# Patient Record
Sex: Male | Born: 1955
Health system: Southern US, Community
[De-identification: ages and names within clinical notes are randomized; demographics above are authoritative.]

## PROBLEM LIST (undated history)

## (undated) DIAGNOSIS — C4492 Squamous cell carcinoma of skin, unspecified: Secondary | ICD-10-CM

## (undated) DIAGNOSIS — K635 Polyp of colon: Secondary | ICD-10-CM

## (undated) DIAGNOSIS — I10 Essential (primary) hypertension: Secondary | ICD-10-CM

## (undated) HISTORY — PX: BACK SURGERY: SHX140

## (undated) HISTORY — PX: OTHER SURGICAL HISTORY: SHX169

## (undated) HISTORY — PX: HERNIA REPAIR: SHX51

## (undated) HISTORY — DX: Squamous cell carcinoma of skin, unspecified: C44.92

## (undated) HISTORY — PX: COLONOSCOPY: SHX174

---

## 1999-08-13 HISTORY — PX: OTHER SURGICAL HISTORY: SHX169

## 2006-10-20 ENCOUNTER — Ambulatory Visit (HOSPITAL_COMMUNITY): Admission: RE | Admit: 2006-10-20 | Discharge: 2006-10-20 | Payer: Self-pay | Admitting: Internal Medicine

## 2006-10-20 ENCOUNTER — Ambulatory Visit: Payer: Self-pay | Admitting: Internal Medicine

## 2013-07-26 ENCOUNTER — Other Ambulatory Visit: Payer: Self-pay | Admitting: Dermatology

## 2013-07-26 DIAGNOSIS — D229 Melanocytic nevi, unspecified: Secondary | ICD-10-CM

## 2013-07-26 HISTORY — DX: Melanocytic nevi, unspecified: D22.9

## 2013-09-09 ENCOUNTER — Other Ambulatory Visit: Payer: Self-pay | Admitting: Dermatology

## 2014-03-16 ENCOUNTER — Encounter (INDEPENDENT_AMBULATORY_CARE_PROVIDER_SITE_OTHER): Payer: Self-pay | Admitting: *Deleted

## 2014-03-23 ENCOUNTER — Other Ambulatory Visit (INDEPENDENT_AMBULATORY_CARE_PROVIDER_SITE_OTHER): Payer: Self-pay | Admitting: *Deleted

## 2014-03-23 ENCOUNTER — Encounter (INDEPENDENT_AMBULATORY_CARE_PROVIDER_SITE_OTHER): Payer: Self-pay | Admitting: *Deleted

## 2014-03-23 DIAGNOSIS — Z86010 Personal history of colonic polyps: Secondary | ICD-10-CM

## 2014-05-13 ENCOUNTER — Telehealth (INDEPENDENT_AMBULATORY_CARE_PROVIDER_SITE_OTHER): Payer: Self-pay | Admitting: *Deleted

## 2014-05-13 DIAGNOSIS — Z1211 Encounter for screening for malignant neoplasm of colon: Secondary | ICD-10-CM

## 2014-05-13 NOTE — Telephone Encounter (Signed)
Patient needs movi prep 

## 2014-05-16 MED ORDER — PEG-KCL-NACL-NASULF-NA ASC-C 100 G PO SOLR: 1.0000 | Freq: Once | ORAL | Status: DC

## 2014-05-19 ENCOUNTER — Telehealth (INDEPENDENT_AMBULATORY_CARE_PROVIDER_SITE_OTHER): Payer: Self-pay | Admitting: *Deleted

## 2014-05-19 NOTE — Telephone Encounter (Signed)
PCP: daniel   Procedure: tcs  Reason/Indication:  Hx polyps  Has patient had this procedure before?  Yes, 2008 -- epic  If so, when, by whom and where?    Is there a family history of colon cancer?  no  Who?  What age when diagnosed?    Is patient diabetic?   no      Does patient have prosthetic heart valve?  no  Do you have a pacemaker?  no  Has patient ever had endocarditis? no  Has patient had joint replacement within last 12 months?  no  Does patient tend to be constipated or take laxatives? no  Is patient on Coumadin, Plavix and/or Aspirin? yes  Medications: asa 81 mg daily, cardizam 300 mg daily  Allergies: nkda  Medication Adjustment: asa 2 days  Procedure date & time: 06/15/14 at 930

## 2014-05-19 NOTE — Telephone Encounter (Signed)
agree

## 2014-05-31 ENCOUNTER — Encounter (HOSPITAL_COMMUNITY): Payer: Self-pay | Admitting: Pharmacy Technician

## 2014-06-15 ENCOUNTER — Encounter (HOSPITAL_COMMUNITY): Payer: Self-pay | Admitting: *Deleted

## 2014-06-15 ENCOUNTER — Ambulatory Visit (HOSPITAL_COMMUNITY)
Admission: RE | Admit: 2014-06-15 | Discharge: 2014-06-15 | Disposition: A | Discharge status: Home / Self Care | Payer: 59 | Source: Ambulatory Visit | Attending: Internal Medicine | Admitting: Internal Medicine

## 2014-06-15 ENCOUNTER — Encounter (HOSPITAL_COMMUNITY)
Admission: RE | Disposition: A | Discharge status: Home / Self Care | Payer: Self-pay | Source: Ambulatory Visit | Attending: Internal Medicine

## 2014-06-15 DIAGNOSIS — K648 Other hemorrhoids: Secondary | ICD-10-CM | POA: Insufficient documentation

## 2014-06-15 DIAGNOSIS — I10 Essential (primary) hypertension: Secondary | ICD-10-CM | POA: Insufficient documentation

## 2014-06-15 DIAGNOSIS — Z86010 Personal history of colonic polyps: Secondary | ICD-10-CM | POA: Insufficient documentation

## 2014-06-15 DIAGNOSIS — K644 Residual hemorrhoidal skin tags: Secondary | ICD-10-CM | POA: Insufficient documentation

## 2014-06-15 DIAGNOSIS — Z1211 Encounter for screening for malignant neoplasm of colon: Secondary | ICD-10-CM | POA: Insufficient documentation

## 2014-06-15 DIAGNOSIS — K5790 Diverticulosis of intestine, part unspecified, without perforation or abscess without bleeding: Secondary | ICD-10-CM | POA: Insufficient documentation

## 2014-06-15 DIAGNOSIS — K5730 Diverticulosis of large intestine without perforation or abscess without bleeding: Secondary | ICD-10-CM

## 2014-06-15 DIAGNOSIS — K649 Unspecified hemorrhoids: Secondary | ICD-10-CM

## 2014-06-15 DIAGNOSIS — Z79899 Other long term (current) drug therapy: Secondary | ICD-10-CM | POA: Insufficient documentation

## 2014-06-15 HISTORY — DX: Essential (primary) hypertension: I10

## 2014-06-15 HISTORY — PX: COLONOSCOPY: SHX5424

## 2014-06-15 HISTORY — DX: Polyp of colon: K63.5

## 2014-06-15 SURGERY — COLONOSCOPY
Anesthesia: Moderate Sedation

## 2014-06-15 MED ORDER — MIDAZOLAM HCL 5 MG/5ML IJ SOLN
INTRAMUSCULAR | Status: DC | PRN
  Administered 2014-06-15 (×4): 2 mg via INTRAVENOUS

## 2014-06-15 MED ORDER — SODIUM CHLORIDE 0.9 % IV SOLN
INTRAVENOUS | Status: DC
  Administered 2014-06-15: 1000 mL via INTRAVENOUS

## 2014-06-15 MED ORDER — STERILE WATER FOR IRRIGATION IR SOLN
Status: DC | PRN
  Administered 2014-06-15: 09:00:00

## 2014-06-15 MED ORDER — MIDAZOLAM HCL 5 MG/5ML IJ SOLN
INTRAMUSCULAR | Status: AC
  Filled 2014-06-15: qty 10

## 2014-06-15 MED ORDER — MEPERIDINE HCL 50 MG/ML IJ SOLN
INTRAMUSCULAR | Status: AC
  Filled 2014-06-15: qty 1

## 2014-06-15 MED ORDER — MEPERIDINE HCL 50 MG/ML IJ SOLN
INTRAMUSCULAR | Status: DC | PRN
  Administered 2014-06-15 (×2): 25 mg via INTRAVENOUS

## 2014-06-15 NOTE — H&P (Addendum)
Michael Marks is an 58 y.o. male.   Chief Complaint: patient is here for colonoscopy. HPI:  patient is 57 year old Caucasian male who was history of colonic polyps and is here for surveillance colonoscopy. His last exam was around 7 years ago with removal of  small adenoma.he was therefore advised to wait 7 years. He denies abdominal pain change in his bowel habits or rectal bleeding. History is negative for CRC.  Past Medical History  Diagnosis Date  . Colon polyps   . Hypertension     Past Surgical History  Procedure Laterality Date  . Colonoscopy    . Hernia repair    . Left shoulder    . Back surgery    . Left foot  2001    cyst removal    History reviewed. No pertinent family history. Social History:  reports that he has never smoked. He does not have any smokeless tobacco history on file. He reports that he drinks alcohol. He reports that he does not use illicit drugs.  Allergies: No Known Allergies  Medications Prior to Admission  Medication Sig Dispense Refill  . aspirin EC 81 MG tablet Take 81 mg by mouth daily.    Marland Kitchen diltiazem (CARDIZEM CD) 300 MG 24 hr capsule Take 300 mg by mouth daily.    . peg 3350 powder (MOVIPREP) 100 G SOLR Take 1 kit (200 g total) by mouth once. 1 kit 0    No results found for this or any previous visit (from the past 48 hour(s)). No results found.  ROS  Blood pressure 141/82, pulse 81, temperature 98.4 F (36.9 C), temperature source Oral, resp. rate 18, height 6' 2"  (1.88 m), weight 215 lb (97.523 kg), SpO2 96 %. Physical Exam  Constitutional: He appears well-developed and well-nourished.  HENT:  Mouth/Throat: Oropharynx is clear and moist.  Eyes: Conjunctivae are normal. No scleral icterus.  Neck: No thyromegaly present.  Cardiovascular: Normal rate, regular rhythm and normal heart sounds.   No murmur heard. Respiratory: Effort normal and breath sounds normal.  GI: Soft. He exhibits no distension and no mass. There is no tenderness.   Musculoskeletal: He exhibits no edema.  Lymphadenopathy:    He has no cervical adenopathy.  Neurological: He is alert.  Skin: Skin is warm and dry.     Assessment/Plan History of colonic polyps. Surveillance colonoscopy.  Michael Marks U 06/15/2014, 9:12 AM

## 2014-06-15 NOTE — Op Note (Signed)
COLONOSCOPY PROCEDURE REPORT  PATIENT:  Michael Marks  MR#:  [id number] Birthdate:  1956-07-29, 58 y.o., male Endoscopist:  Dr. Rogene Houston, MD Referred By:  Tawanna Cooler, MD Procedure Date: 06/15/2014  Procedure:   Colonoscopy  Indications: Patient is 58 year old Caucasian male who is here for surveillance colonoscopy.patient's last exam was in March 2008 with removal of single small polyp which was tubular adenoma. He has no GI symptoms and family history is negative for CRC.  Informed Consent:  The procedure and risks were reviewed with the patient and informed consent was obtained.  Medications:  Demerol 50 mg IV Versed 8 mg IV  Description of procedure:  After a digital rectal exam was performed, that colonoscope was advanced from the anus through the rectum and colon to the area of the cecum, ileocecal valve and appendiceal orifice. The cecum was deeply intubated. These structures were well-seen and photographed for the record. From the level of the cecum and ileocecal valve, the scope was slowly and cautiously withdrawn. The mucosal surfaces were carefully surveyed utilizing scope tip to flexion to facilitate fold flattening as needed. The scope was pulled down into the rectum where a thorough exam including retroflexion was performed.  Findings:   Prep excellent. Single small diverticulum at sigmoid colon. Normal rectal mucosa. Hemorrhoids noted above and below the dentate line.   Therapeutic/Diagnostic Maneuvers Performed:   None  Complications:  none  Cecal Withdrawal Time:  12 minutes  Impression:  Examination performed to cecum. No evidence of recurrent polyps. Single small diverticulum at sigmoid colon. Internal and external hemorrhoids.  Recommendations:  Standard instructions given. Patient and wait 10 years before next colonoscopy unless new symptoms develop in which case exam may have to be performed earlier.  Dolce Sylvia U  06/15/2014 9:44 AM  CC:  Dr. Gar Ponto, MD & Dr. Rayne Du ref. provider found

## 2014-06-15 NOTE — Discharge Instructions (Signed)
Resume usual medications and diet. No driving for 24 hours. Next colonoscopy in 10 years.    Colonoscopy, Care After These instructions give you information on caring for yourself after your procedure. Your doctor may also give you more specific instructions. Call your doctor if you have any problems or questions after your procedure. HOME CARE  Do not drive for 24 hours.  Do not sign important papers or use machinery for 24 hours.  You may shower.  You may go back to your usual activities, but go slower for the first 24 hours.  Take rest breaks often during the first 24 hours.  Walk around or use warm packs on your belly (abdomen) if you have belly cramping or gas.  Drink enough fluids to keep your pee (urine) clear or pale yellow.  Resume your normal diet. Avoid heavy or fried foods.  Avoid drinking alcohol for 24 hours or as told by your doctor.  Only take medicines as told by your doctor. If a tissue sample (biopsy) was taken during the procedure:   Do not take aspirin or blood thinners for 7 days, or as told by your doctor.  Do not drink alcohol for 7 days, or as told by your doctor.  Eat soft foods for the first 24 hours. GET HELP IF: You still have a small amount of blood in your poop (stool) 2-3 days after the procedure. GET HELP RIGHT AWAY IF:  You have more than a small amount of blood in your poop.  You see clumps of tissue (blood clots) in your poop.  Your belly is puffy (swollen).  You feel sick to your stomach (nauseous) or throw up (vomit).  You have a fever.  You have belly pain that gets worse and medicine does not help. MAKE SURE YOU:  Understand these instructions.  Will watch your condition.  Will get help right away if you are not doing well or get worse. Document Released: 08/31/2010 Document Revised: 08/03/2013 Document Reviewed: 04/05/2013 [facility] Patient Information 2015 [place], [place]. This information is not intended to replace  advice given to you by your health care provider. Make sure you discuss any questions you have with your health care provider.

## 2014-06-16 ENCOUNTER — Encounter (HOSPITAL_COMMUNITY): Payer: Self-pay | Admitting: Internal Medicine

## 2014-06-22 ENCOUNTER — Encounter (INDEPENDENT_AMBULATORY_CARE_PROVIDER_SITE_OTHER): Payer: Self-pay

## 2016-09-10 ENCOUNTER — Encounter: Payer: Self-pay | Admitting: Internal Medicine

## 2018-01-16 DIAGNOSIS — L247 Irritant contact dermatitis due to plants, except food: Secondary | ICD-10-CM | POA: Diagnosis not present

## 2018-01-16 DIAGNOSIS — Z6828 Body mass index (BMI) 28.0-28.9, adult: Secondary | ICD-10-CM | POA: Diagnosis not present

## 2018-06-01 DIAGNOSIS — Z0001 Encounter for general adult medical examination with abnormal findings: Secondary | ICD-10-CM | POA: Diagnosis not present

## 2018-06-04 DIAGNOSIS — Z1212 Encounter for screening for malignant neoplasm of rectum: Secondary | ICD-10-CM | POA: Diagnosis not present

## 2018-06-04 DIAGNOSIS — I10 Essential (primary) hypertension: Secondary | ICD-10-CM | POA: Diagnosis not present

## 2018-06-04 DIAGNOSIS — I83812 Varicose veins of left lower extremities with pain: Secondary | ICD-10-CM | POA: Diagnosis not present

## 2018-06-04 DIAGNOSIS — M545 Low back pain: Secondary | ICD-10-CM | POA: Diagnosis not present

## 2018-06-04 DIAGNOSIS — Z0001 Encounter for general adult medical examination with abnormal findings: Secondary | ICD-10-CM | POA: Diagnosis not present

## 2018-06-04 DIAGNOSIS — N400 Enlarged prostate without lower urinary tract symptoms: Secondary | ICD-10-CM | POA: Diagnosis not present

## 2019-01-21 DIAGNOSIS — N400 Enlarged prostate without lower urinary tract symptoms: Secondary | ICD-10-CM | POA: Diagnosis not present

## 2019-01-21 DIAGNOSIS — E782 Mixed hyperlipidemia: Secondary | ICD-10-CM | POA: Diagnosis not present

## 2019-01-25 DIAGNOSIS — R972 Elevated prostate specific antigen [PSA]: Secondary | ICD-10-CM | POA: Diagnosis not present

## 2019-01-25 DIAGNOSIS — M545 Low back pain: Secondary | ICD-10-CM | POA: Diagnosis not present

## 2019-01-25 DIAGNOSIS — I83812 Varicose veins of left lower extremities with pain: Secondary | ICD-10-CM | POA: Diagnosis not present

## 2019-01-25 DIAGNOSIS — R079 Chest pain, unspecified: Secondary | ICD-10-CM | POA: Diagnosis not present

## 2019-01-25 DIAGNOSIS — I10 Essential (primary) hypertension: Secondary | ICD-10-CM | POA: Diagnosis not present

## 2019-01-25 DIAGNOSIS — N400 Enlarged prostate without lower urinary tract symptoms: Secondary | ICD-10-CM | POA: Diagnosis not present

## 2019-01-25 DIAGNOSIS — Z6827 Body mass index (BMI) 27.0-27.9, adult: Secondary | ICD-10-CM | POA: Diagnosis not present

## 2019-02-10 DIAGNOSIS — H524 Presbyopia: Secondary | ICD-10-CM | POA: Diagnosis not present

## 2019-07-21 DIAGNOSIS — I10 Essential (primary) hypertension: Secondary | ICD-10-CM | POA: Diagnosis not present

## 2019-07-21 DIAGNOSIS — R972 Elevated prostate specific antigen [PSA]: Secondary | ICD-10-CM | POA: Diagnosis not present

## 2019-07-21 DIAGNOSIS — E782 Mixed hyperlipidemia: Secondary | ICD-10-CM | POA: Diagnosis not present

## 2019-07-26 DIAGNOSIS — R972 Elevated prostate specific antigen [PSA]: Secondary | ICD-10-CM | POA: Diagnosis not present

## 2019-07-26 DIAGNOSIS — N400 Enlarged prostate without lower urinary tract symptoms: Secondary | ICD-10-CM | POA: Diagnosis not present

## 2019-07-26 DIAGNOSIS — Z1212 Encounter for screening for malignant neoplasm of rectum: Secondary | ICD-10-CM | POA: Diagnosis not present

## 2019-07-26 DIAGNOSIS — Z6828 Body mass index (BMI) 28.0-28.9, adult: Secondary | ICD-10-CM | POA: Diagnosis not present

## 2019-07-26 DIAGNOSIS — M545 Low back pain: Secondary | ICD-10-CM | POA: Diagnosis not present

## 2019-07-26 DIAGNOSIS — I10 Essential (primary) hypertension: Secondary | ICD-10-CM | POA: Diagnosis not present

## 2019-07-26 DIAGNOSIS — I83812 Varicose veins of left lower extremities with pain: Secondary | ICD-10-CM | POA: Diagnosis not present

## 2019-07-26 DIAGNOSIS — Z0001 Encounter for general adult medical examination with abnormal findings: Secondary | ICD-10-CM | POA: Diagnosis not present

## 2020-01-03 ENCOUNTER — Other Ambulatory Visit: Payer: Self-pay | Admitting: *Deleted

## 2020-01-03 DIAGNOSIS — Z8249 Family history of ischemic heart disease and other diseases of the circulatory system: Secondary | ICD-10-CM

## 2020-01-17 DIAGNOSIS — E782 Mixed hyperlipidemia: Secondary | ICD-10-CM | POA: Diagnosis not present

## 2020-01-17 DIAGNOSIS — I10 Essential (primary) hypertension: Secondary | ICD-10-CM | POA: Diagnosis not present

## 2020-01-17 DIAGNOSIS — R972 Elevated prostate specific antigen [PSA]: Secondary | ICD-10-CM | POA: Diagnosis not present

## 2020-01-17 DIAGNOSIS — R600 Localized edema: Secondary | ICD-10-CM | POA: Diagnosis not present

## 2020-01-17 DIAGNOSIS — R946 Abnormal results of thyroid function studies: Secondary | ICD-10-CM | POA: Diagnosis not present

## 2020-01-19 ENCOUNTER — Other Ambulatory Visit: Payer: Self-pay

## 2020-01-19 ENCOUNTER — Ambulatory Visit
Admission: RE | Admit: 2020-01-19 | Discharge: 2020-01-19 | Disposition: A | Discharge status: Home / Self Care | Payer: Self-pay | Source: Ambulatory Visit | Attending: Cardiovascular Disease | Admitting: Cardiovascular Disease

## 2020-01-19 DIAGNOSIS — Z8249 Family history of ischemic heart disease and other diseases of the circulatory system: Secondary | ICD-10-CM | POA: Insufficient documentation

## 2020-01-20 ENCOUNTER — Telehealth: Payer: Self-pay | Admitting: Cardiovascular Disease

## 2020-01-20 DIAGNOSIS — M545 Low back pain: Secondary | ICD-10-CM | POA: Diagnosis not present

## 2020-01-20 DIAGNOSIS — I83812 Varicose veins of left lower extremities with pain: Secondary | ICD-10-CM | POA: Diagnosis not present

## 2020-01-20 DIAGNOSIS — I10 Essential (primary) hypertension: Secondary | ICD-10-CM | POA: Diagnosis not present

## 2020-01-20 DIAGNOSIS — Z6828 Body mass index (BMI) 28.0-28.9, adult: Secondary | ICD-10-CM | POA: Diagnosis not present

## 2020-01-20 DIAGNOSIS — N400 Enlarged prostate without lower urinary tract symptoms: Secondary | ICD-10-CM | POA: Diagnosis not present

## 2020-01-20 DIAGNOSIS — R972 Elevated prostate specific antigen [PSA]: Secondary | ICD-10-CM | POA: Diagnosis not present

## 2020-01-20 NOTE — Telephone Encounter (Signed)
Patient spouse calling Wants to know if we will be able to fax CT results to Dr Gar Ponto at [place] today Patient is at an appointment Please call to discuss

## 2020-01-20 NOTE — Telephone Encounter (Signed)
Spoke with patient regarding his results and advised that his results are still pending provider review. He stated that his wife received her results already through My Chart. Reviewed that those results are also still pending provider review. I did review his preliminary results and that I would call him back once provider finalizes the report. He verbalized understanding with no further questions at this time. I did fax link for him to sign up for My Chart.

## 2020-01-24 ENCOUNTER — Telehealth: Payer: Self-pay

## 2020-01-24 NOTE — Telephone Encounter (Signed)
-----   Message from Minna Merritts, MD sent at 01/23/2020  6:29 PM EDT ----- CT coronary calcium score Score of 0 Excellent finding, indicating no calcified coronary atherosclerotic plaque noted  No other significant findings on the scan were picked up

## 2020-01-24 NOTE — Telephone Encounter (Signed)
Call to patient to review CT calcium score results.    Pt verbalized understanding and has no further questions at this time.    Advised pt to call for any further questions or concerns.  No further orders.

## 2020-06-26 DIAGNOSIS — Z0100 Encounter for examination of eyes and vision without abnormal findings: Secondary | ICD-10-CM | POA: Diagnosis not present

## 2020-07-25 DIAGNOSIS — E782 Mixed hyperlipidemia: Secondary | ICD-10-CM | POA: Diagnosis not present

## 2020-07-25 DIAGNOSIS — N400 Enlarged prostate without lower urinary tract symptoms: Secondary | ICD-10-CM | POA: Diagnosis not present

## 2020-07-25 DIAGNOSIS — Z1329 Encounter for screening for other suspected endocrine disorder: Secondary | ICD-10-CM | POA: Diagnosis not present

## 2020-07-25 DIAGNOSIS — Z0001 Encounter for general adult medical examination with abnormal findings: Secondary | ICD-10-CM | POA: Diagnosis not present

## 2020-07-25 DIAGNOSIS — I10 Essential (primary) hypertension: Secondary | ICD-10-CM | POA: Diagnosis not present

## 2020-07-25 DIAGNOSIS — R972 Elevated prostate specific antigen [PSA]: Secondary | ICD-10-CM | POA: Diagnosis not present

## 2020-07-28 DIAGNOSIS — I83812 Varicose veins of left lower extremities with pain: Secondary | ICD-10-CM | POA: Diagnosis not present

## 2020-07-28 DIAGNOSIS — Z6829 Body mass index (BMI) 29.0-29.9, adult: Secondary | ICD-10-CM | POA: Diagnosis not present

## 2020-07-28 DIAGNOSIS — I10 Essential (primary) hypertension: Secondary | ICD-10-CM | POA: Diagnosis not present

## 2020-07-28 DIAGNOSIS — Z0001 Encounter for general adult medical examination with abnormal findings: Secondary | ICD-10-CM | POA: Diagnosis not present

## 2020-07-28 DIAGNOSIS — N400 Enlarged prostate without lower urinary tract symptoms: Secondary | ICD-10-CM | POA: Diagnosis not present

## 2021-01-06 IMAGING — CT CT CARDIAC CORONARY ARTERY CALCIUM SCORE
1 of 3 series · 8 of 20 positions shown, 10 images · non-contrast
Comparison: Chest radiograph 01/25/2019.
COMPARISON: Chest radiograph 01/25/2019.

Addendum:
EXAM:
OVER-READ INTERPRETATION  CT CHEST

The following report is an over-read performed by radiologist Dr.
Heber Daniel Zenone [REDACTED] on 01/19/2020. This over-read
does not include interpretation of cardiac or coronary anatomy or
pathology. The calcium score interpretation by the cardiologist is
attached.
CLINICAL DATA: Risk stratification
Coronary Calcium Score
TECHNIQUE: The patient was scanned on a Siemens go.Top Scanner. Axial
non-contrast 3 mm slices were carried out through the heart. The
data set was analyzed on a dedicated work station and scored using
the Agatson method.

[Series 2: sa36 calcium scoring 3.00 · axial · 0.40mm/px · z∈[-1141,-1036]mm · 8 of 45 slices shown, 10 images]
[im 5/45  vessel]
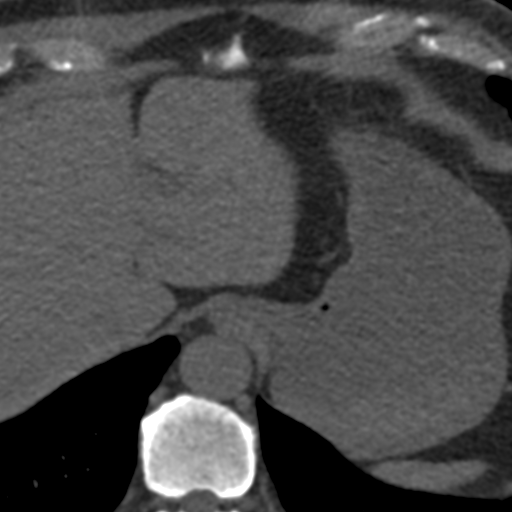
[im 5/45  lung]
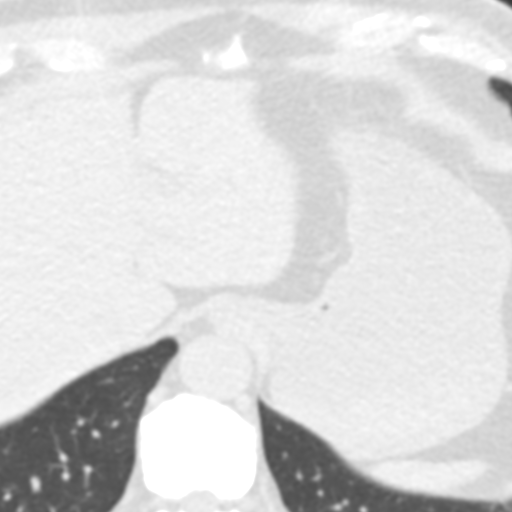
[im 10/45  vessel]
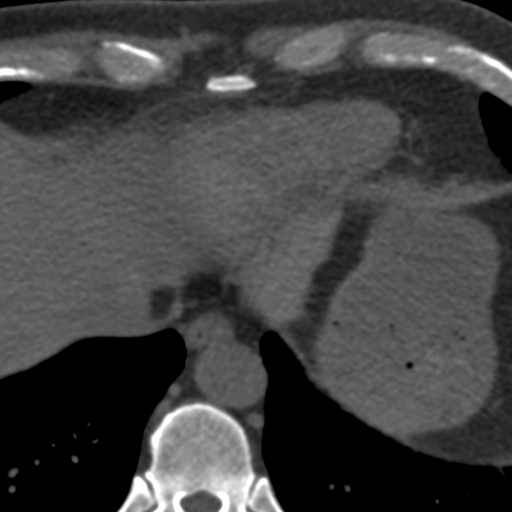
[im 15/45  vessel]
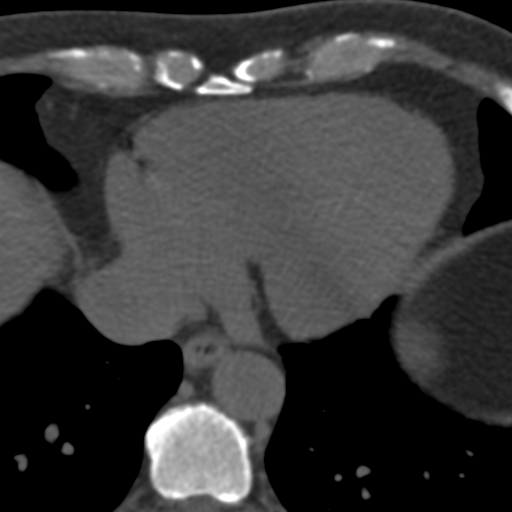
[im 20/45  vessel]
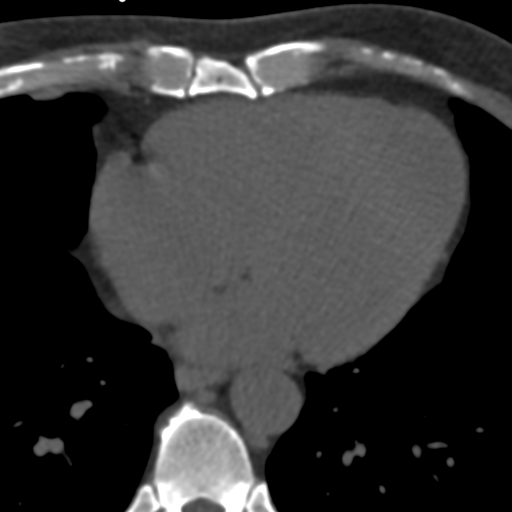
[im 25/45  vessel]
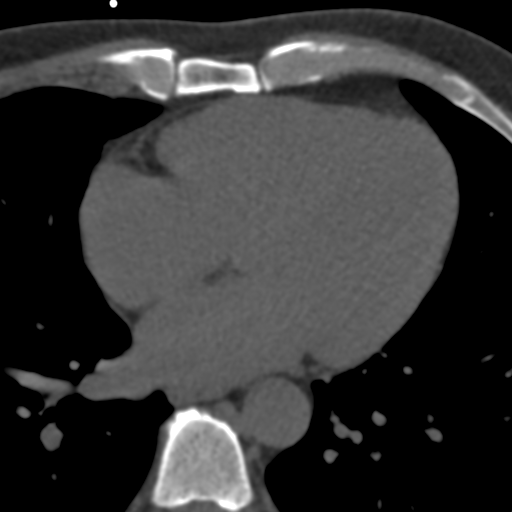
[im 25/45  lung]
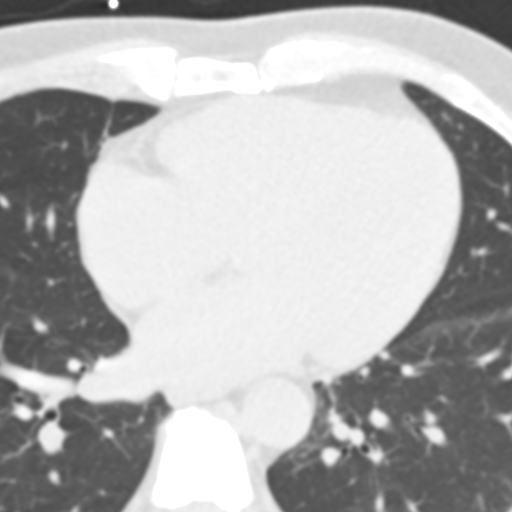
[im 30/45  vessel]
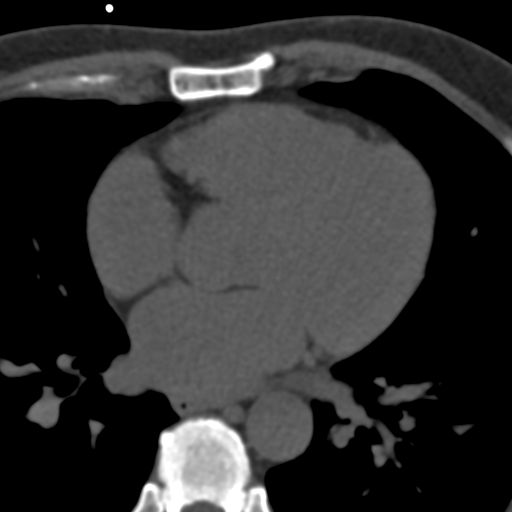
[im 35/45  vessel]
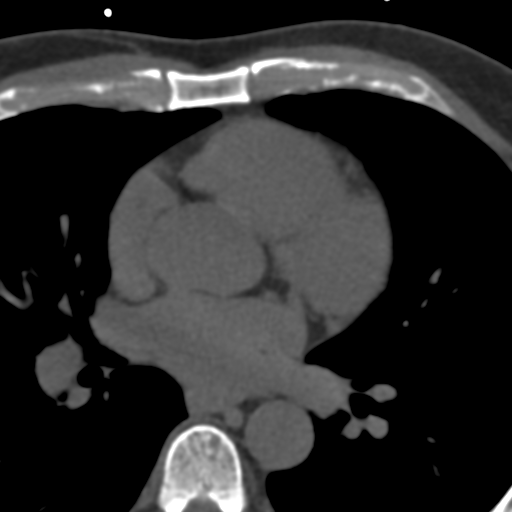
[im 40/45  vessel]
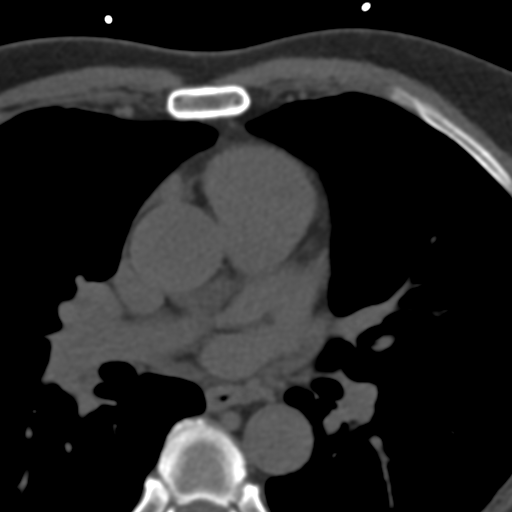

[8 of 20 positions shown; findings below may reference images not displayed]

FINDINGS: Vascular: Normal aortic caliber.  Tortuous thoracic aorta.

Mediastinum/Nodes: No imaged thoracic adenopathy.

Lungs/Pleura: No pleural fluid.  Clear imaged lungs.

Upper Abdomen: Normal imaged portions of the liver, spleen, stomach.

Musculoskeletal: No acute osseous abnormality.
IMPRESSION: No acute findings in the imaged extracardiac chest.
FINDINGS: Non-cardiac: See separate report from [REDACTED].

Ascending Aorta: normal

Pericardium: Normal

Coronary arteries: Normal origin of left and right coronary arteries

No calcification noted in the coronary arteries
IMPRESSION: Coronary calcium score of 0. Patient is low risk for near term
coronary events

*** End of Addendum ***
EXAM:
OVER-READ INTERPRETATION  CT CHEST

The following report is an over-read performed by radiologist Dr.
Heber Daniel Zenone [REDACTED] on 01/19/2020. This over-read
does not include interpretation of cardiac or coronary anatomy or
pathology. The calcium score interpretation by the cardiologist is
attached.
FINDINGS: Vascular: Normal aortic caliber.  Tortuous thoracic aorta.

Mediastinum/Nodes: No imaged thoracic adenopathy.

Lungs/Pleura: No pleural fluid.  Clear imaged lungs.

Upper Abdomen: Normal imaged portions of the liver, spleen, stomach.

Musculoskeletal: No acute osseous abnormality.
IMPRESSION: No acute findings in the imaged extracardiac chest.

## 2021-01-19 DIAGNOSIS — E7849 Other hyperlipidemia: Secondary | ICD-10-CM | POA: Diagnosis not present

## 2021-01-19 DIAGNOSIS — M10371 Gout due to renal impairment, right ankle and foot: Secondary | ICD-10-CM | POA: Diagnosis not present

## 2021-01-19 DIAGNOSIS — I10 Essential (primary) hypertension: Secondary | ICD-10-CM | POA: Diagnosis not present

## 2021-01-19 DIAGNOSIS — E782 Mixed hyperlipidemia: Secondary | ICD-10-CM | POA: Diagnosis not present

## 2021-01-19 DIAGNOSIS — Z1159 Encounter for screening for other viral diseases: Secondary | ICD-10-CM | POA: Diagnosis not present

## 2021-01-22 DIAGNOSIS — Z6829 Body mass index (BMI) 29.0-29.9, adult: Secondary | ICD-10-CM | POA: Diagnosis not present

## 2021-01-22 DIAGNOSIS — N400 Enlarged prostate without lower urinary tract symptoms: Secondary | ICD-10-CM | POA: Diagnosis not present

## 2021-01-22 DIAGNOSIS — I83812 Varicose veins of left lower extremities with pain: Secondary | ICD-10-CM | POA: Diagnosis not present

## 2021-01-22 DIAGNOSIS — M545 Low back pain, unspecified: Secondary | ICD-10-CM | POA: Diagnosis not present

## 2021-01-22 DIAGNOSIS — I10 Essential (primary) hypertension: Secondary | ICD-10-CM | POA: Diagnosis not present

## 2021-01-22 DIAGNOSIS — Z23 Encounter for immunization: Secondary | ICD-10-CM | POA: Diagnosis not present

## 2021-02-05 DIAGNOSIS — Z803 Family history of malignant neoplasm of breast: Secondary | ICD-10-CM | POA: Diagnosis not present

## 2021-02-05 DIAGNOSIS — Z6829 Body mass index (BMI) 29.0-29.9, adult: Secondary | ICD-10-CM | POA: Diagnosis not present

## 2021-02-05 DIAGNOSIS — E663 Overweight: Secondary | ICD-10-CM | POA: Diagnosis not present

## 2021-02-05 DIAGNOSIS — Z7982 Long term (current) use of aspirin: Secondary | ICD-10-CM | POA: Diagnosis not present

## 2021-02-05 DIAGNOSIS — M792 Neuralgia and neuritis, unspecified: Secondary | ICD-10-CM | POA: Diagnosis not present

## 2021-02-05 DIAGNOSIS — Z7722 Contact with and (suspected) exposure to environmental tobacco smoke (acute) (chronic): Secondary | ICD-10-CM | POA: Diagnosis not present

## 2021-02-05 DIAGNOSIS — G8929 Other chronic pain: Secondary | ICD-10-CM | POA: Diagnosis not present

## 2021-02-05 DIAGNOSIS — N400 Enlarged prostate without lower urinary tract symptoms: Secondary | ICD-10-CM | POA: Diagnosis not present

## 2021-02-05 DIAGNOSIS — M1990 Unspecified osteoarthritis, unspecified site: Secondary | ICD-10-CM | POA: Diagnosis not present

## 2021-02-05 DIAGNOSIS — I10 Essential (primary) hypertension: Secondary | ICD-10-CM | POA: Diagnosis not present

## 2021-07-30 ENCOUNTER — Ambulatory Visit: Payer: Medicare HMO | Admitting: Physician Assistant

## 2021-07-30 ENCOUNTER — Other Ambulatory Visit: Payer: Self-pay

## 2021-07-30 ENCOUNTER — Encounter: Payer: Self-pay | Admitting: Physician Assistant

## 2021-07-30 DIAGNOSIS — C44519 Basal cell carcinoma of skin of other part of trunk: Secondary | ICD-10-CM | POA: Diagnosis not present

## 2021-07-30 DIAGNOSIS — Z1329 Encounter for screening for other suspected endocrine disorder: Secondary | ICD-10-CM | POA: Diagnosis not present

## 2021-07-30 DIAGNOSIS — E7849 Other hyperlipidemia: Secondary | ICD-10-CM | POA: Diagnosis not present

## 2021-07-30 DIAGNOSIS — L738 Other specified follicular disorders: Secondary | ICD-10-CM | POA: Diagnosis not present

## 2021-07-30 DIAGNOSIS — L570 Actinic keratosis: Secondary | ICD-10-CM | POA: Diagnosis not present

## 2021-07-30 DIAGNOSIS — Z1283 Encounter for screening for malignant neoplasm of skin: Secondary | ICD-10-CM

## 2021-07-30 DIAGNOSIS — L820 Inflamed seborrheic keratosis: Secondary | ICD-10-CM

## 2021-07-30 DIAGNOSIS — D485 Neoplasm of uncertain behavior of skin: Secondary | ICD-10-CM

## 2021-07-30 DIAGNOSIS — L821 Other seborrheic keratosis: Secondary | ICD-10-CM

## 2021-07-30 DIAGNOSIS — R972 Elevated prostate specific antigen [PSA]: Secondary | ICD-10-CM | POA: Diagnosis not present

## 2021-07-30 DIAGNOSIS — Z0000 Encounter for general adult medical examination without abnormal findings: Secondary | ICD-10-CM | POA: Diagnosis not present

## 2021-07-30 DIAGNOSIS — E782 Mixed hyperlipidemia: Secondary | ICD-10-CM | POA: Diagnosis not present

## 2021-07-30 DIAGNOSIS — Z86018 Personal history of other benign neoplasm: Secondary | ICD-10-CM

## 2021-07-30 DIAGNOSIS — C44329 Squamous cell carcinoma of skin of other parts of face: Secondary | ICD-10-CM | POA: Diagnosis not present

## 2021-07-30 DIAGNOSIS — Z808 Family history of malignant neoplasm of other organs or systems: Secondary | ICD-10-CM

## 2021-07-30 DIAGNOSIS — I10 Essential (primary) hypertension: Secondary | ICD-10-CM | POA: Diagnosis not present

## 2021-07-30 NOTE — Patient Instructions (Signed)

## 2021-07-30 NOTE — Progress Notes (Addendum)
New Patient   Subjective  Michael Marks is a 65 y.o. male who presents for the following: Annual Exam (Lesions on shoulder x months- seem to be getting darker & left temple- tired of shaving off when I shave my face. Personal history of atypical mole but no non mole skin cancers. Family history of non mole skin cancer but no melanoma. ).   The following portions of the chart were reviewed this encounter and updated as appropriate:  Tobacco   Allergies   Meds   Problems   Med Hx   Surg Hx   Fam Hx       Objective  Well appearing patient in no apparent distress; mood and affect are within normal limits.  All skin waist up examined.  Right Upper Back Smooth papule -bichromic         Left Shoulder - Posterior Brown, crusty plaque       Left Parotid Area Hyperkeratotic scale with pink base        Mid Tip of Nose Erythematous patches with gritty scale.  Mid Back Scaly erythematous macule         Assessment & Plan  Neoplasm of uncertain behavior of skin (3) Right Upper Back  Skin / nail biopsy Type of biopsy: tangential   Informed consent: discussed and consent obtained   Timeout: patient name, date of birth, surgical site, and procedure verified   Procedure prep:  Patient was prepped and draped in usual sterile fashion (Non sterile) Prep type:  Chlorhexidine Anesthesia: the lesion was anesthetized in a standard fashion   Anesthetic:  1% lidocaine w/ epinephrine 1-100,000 local infiltration Instrument used: flexible razor blade   Outcome: patient tolerated procedure well   Post-procedure details: wound care instructions given    Specimen 2 - Surgical pathology Differential Diagnosis: r/o atypia  Check Margins: yes  Left Shoulder - Posterior  Skin / nail biopsy Type of biopsy: tangential   Informed consent: discussed and consent obtained   Timeout: patient name, date of birth, surgical site, and procedure verified   Procedure prep:  Patient was  prepped and draped in usual sterile fashion (Non sterile) Prep type:  Chlorhexidine Anesthesia: the lesion was anesthetized in a standard fashion   Anesthetic:  1% lidocaine w/ epinephrine 1-100,000 local infiltration Instrument used: flexible razor blade   Outcome: patient tolerated procedure well   Post-procedure details: wound care instructions given    Specimen 4 - Surgical pathology Differential Diagnosis: r/o atypia  Check Margins:yes  Left Parotid Area  Skin / nail biopsy Type of biopsy: tangential   Informed consent: discussed and consent obtained   Timeout: patient name, date of birth, surgical site, and procedure verified   Procedure prep:  Patient was prepped and draped in usual sterile fashion (Non sterile) Prep type:  Chlorhexidine Anesthesia: the lesion was anesthetized in a standard fashion   Anesthetic:  1% lidocaine w/ epinephrine 1-100,000 local infiltration Instrument used: flexible razor blade   Outcome: patient tolerated procedure well   Post-procedure details: wound care instructions given    Specimen 3 - Surgical pathology Differential Diagnosis: bcc vs scc  Check Margins: yes  AK (actinic keratosis) Mid Tip of Nose  Destruction of lesion - Mid Tip of Nose Complexity: simple   Destruction method: cryotherapy   Informed consent: discussed and consent obtained   Timeout:  patient name, date of birth, surgical site, and procedure verified Lesion destroyed using liquid nitrogen: Yes   Region frozen until ice ball  extended beyond lesion: Yes   Cryotherapy cycles:  3 Outcome: patient tolerated procedure well with no complications    BCC (basal cell carcinoma), back Mid Back  Epidermal / dermal shaving  Lesion diameter (cm):  1.3 Informed consent: discussed and consent obtained   Timeout: patient name, date of birth, surgical site, and procedure verified   Procedure prep:  Patient was prepped and draped in usual sterile fashion Prep type:   Chlorhexidine Anesthesia: the lesion was anesthetized in a standard fashion   Anesthetic:  1% lidocaine w/ epinephrine 1-100,000 local infiltration Instrument used: DermaBlade   Hemostasis achieved with: aluminum chloride   Outcome: patient tolerated procedure well   Post-procedure details: sterile dressing applied and wound care instructions given   Dressing type: petrolatum gauze, petrolatum and bandage    Specimen 1 - Surgical pathology Differential Diagnosis: bcc vs scc  Check Margins: yes     I, Anesa Fronek, PA-C, have reviewed all documentation's for this visit.  The documentation on 08/16/21 for the exam, diagnosis, procedures and orders are all accurate and complete.

## 2021-08-02 DIAGNOSIS — I10 Essential (primary) hypertension: Secondary | ICD-10-CM | POA: Diagnosis not present

## 2021-08-02 DIAGNOSIS — M545 Low back pain, unspecified: Secondary | ICD-10-CM | POA: Diagnosis not present

## 2021-08-02 DIAGNOSIS — Z0001 Encounter for general adult medical examination with abnormal findings: Secondary | ICD-10-CM | POA: Diagnosis not present

## 2021-08-02 DIAGNOSIS — N400 Enlarged prostate without lower urinary tract symptoms: Secondary | ICD-10-CM | POA: Diagnosis not present

## 2021-08-02 DIAGNOSIS — I83812 Varicose veins of left lower extremities with pain: Secondary | ICD-10-CM | POA: Diagnosis not present

## 2021-08-07 ENCOUNTER — Telehealth: Payer: Self-pay | Admitting: Physician Assistant

## 2021-08-07 NOTE — Telephone Encounter (Signed)
Path to patient and surgery was made. When Michael Marks reviews the path we can always cancel the surgical work in that was made

## 2021-08-07 NOTE — Telephone Encounter (Signed)
Patient is calling for pathology results from last visit with Kelli Sheffield, PA-C. 

## 2021-08-12 DIAGNOSIS — C4491 Basal cell carcinoma of skin, unspecified: Secondary | ICD-10-CM

## 2021-08-12 HISTORY — DX: Basal cell carcinoma of skin, unspecified: C44.91

## 2021-08-15 ENCOUNTER — Telehealth: Payer: Self-pay

## 2021-08-15 NOTE — Telephone Encounter (Signed)
-----   Message from Warren Danes, [place] sent at 08/15/2021  7:41 AM EST ----- BCC on back had clear margins. Left parotid needs a [facility] appointment. He already has a 30. Please change it to 6 month follow up. thanks

## 2021-08-15 NOTE — Telephone Encounter (Signed)
Path to patient 6 month made and referral done to [facility]

## 2021-08-16 NOTE — Addendum Note (Signed)
Addended by: Robyne Askew R on: 08/16/2021 04:02 PM   Modules accepted: Orders

## 2021-08-27 ENCOUNTER — Telehealth: Payer: Self-pay

## 2021-08-27 NOTE — Telephone Encounter (Signed)
Phone call from patient stating that he hasn't heard anything from [facility] regarding an appointment. I checked [facility] and seen that the referral was done, I gave patient [facility]'s phone number.

## 2021-09-26 ENCOUNTER — Encounter: Payer: Medicare HMO | Admitting: Physician Assistant

## 2021-10-04 DIAGNOSIS — C44329 Squamous cell carcinoma of skin of other parts of face: Secondary | ICD-10-CM | POA: Diagnosis not present

## 2021-10-12 DIAGNOSIS — Z803 Family history of malignant neoplasm of breast: Secondary | ICD-10-CM | POA: Diagnosis not present

## 2021-10-12 DIAGNOSIS — Z823 Family history of stroke: Secondary | ICD-10-CM | POA: Diagnosis not present

## 2021-10-12 DIAGNOSIS — Z008 Encounter for other general examination: Secondary | ICD-10-CM | POA: Diagnosis not present

## 2021-10-12 DIAGNOSIS — Z7982 Long term (current) use of aspirin: Secondary | ICD-10-CM | POA: Diagnosis not present

## 2021-10-12 DIAGNOSIS — Z825 Family history of asthma and other chronic lower respiratory diseases: Secondary | ICD-10-CM | POA: Diagnosis not present

## 2021-10-12 DIAGNOSIS — I10 Essential (primary) hypertension: Secondary | ICD-10-CM | POA: Diagnosis not present

## 2021-10-12 DIAGNOSIS — E663 Overweight: Secondary | ICD-10-CM | POA: Diagnosis not present

## 2021-10-12 DIAGNOSIS — Z833 Family history of diabetes mellitus: Secondary | ICD-10-CM | POA: Diagnosis not present

## 2021-10-12 DIAGNOSIS — Z8249 Family history of ischemic heart disease and other diseases of the circulatory system: Secondary | ICD-10-CM | POA: Diagnosis not present

## 2021-10-12 DIAGNOSIS — N400 Enlarged prostate without lower urinary tract symptoms: Secondary | ICD-10-CM | POA: Diagnosis not present

## 2021-10-12 DIAGNOSIS — Z808 Family history of malignant neoplasm of other organs or systems: Secondary | ICD-10-CM | POA: Diagnosis not present

## 2021-10-12 DIAGNOSIS — Z85828 Personal history of other malignant neoplasm of skin: Secondary | ICD-10-CM | POA: Diagnosis not present

## 2022-01-28 DIAGNOSIS — Z131 Encounter for screening for diabetes mellitus: Secondary | ICD-10-CM | POA: Diagnosis not present

## 2022-01-28 DIAGNOSIS — E782 Mixed hyperlipidemia: Secondary | ICD-10-CM | POA: Diagnosis not present

## 2022-01-28 DIAGNOSIS — E7849 Other hyperlipidemia: Secondary | ICD-10-CM | POA: Diagnosis not present

## 2022-01-31 DIAGNOSIS — Z6828 Body mass index (BMI) 28.0-28.9, adult: Secondary | ICD-10-CM | POA: Diagnosis not present

## 2022-01-31 DIAGNOSIS — M545 Low back pain, unspecified: Secondary | ICD-10-CM | POA: Diagnosis not present

## 2022-01-31 DIAGNOSIS — I83812 Varicose veins of left lower extremities with pain: Secondary | ICD-10-CM | POA: Diagnosis not present

## 2022-01-31 DIAGNOSIS — I10 Essential (primary) hypertension: Secondary | ICD-10-CM | POA: Diagnosis not present

## 2022-01-31 DIAGNOSIS — N400 Enlarged prostate without lower urinary tract symptoms: Secondary | ICD-10-CM | POA: Diagnosis not present

## 2022-02-19 ENCOUNTER — Encounter: Payer: Self-pay | Admitting: Physician Assistant

## 2022-02-19 ENCOUNTER — Ambulatory Visit: Payer: Medicare HMO | Admitting: Physician Assistant

## 2022-02-19 DIAGNOSIS — L821 Other seborrheic keratosis: Secondary | ICD-10-CM | POA: Diagnosis not present

## 2022-02-19 DIAGNOSIS — L570 Actinic keratosis: Secondary | ICD-10-CM | POA: Diagnosis not present

## 2022-02-19 NOTE — Progress Notes (Signed)
   Follow-Up Visit   Subjective  Michael Marks is a 65 y.o. male who presents for the following: Follow-up (Left parotid area follow up after [facility], left cheek and right lower back just feels different per patient x months no bleeding.).   The following portions of the chart were reviewed this encounter and updated as appropriate:  Tobacco  Allergies  Meds  Problems  Med Hx  Surg Hx  Fam Hx      Objective  Well appearing patient in no apparent distress; mood and affect are within normal limits.  All skin waist up examined.  Left Zygomatic Area Erythematous patches with gritty scale.  Mid Back Stuck-on, waxy papules and plaques.    Assessment & Plan  AK (actinic keratosis) Left Zygomatic Area  Destruction of lesion - Left Zygomatic Area Complexity: simple   Destruction method: cryotherapy   Informed consent: discussed and consent obtained   Timeout:  patient name, date of birth, surgical site, and procedure verified Lesion destroyed using liquid nitrogen: Yes   Outcome: patient tolerated procedure well with no complications    Seborrheic keratosis Mid Back  Leave if stable    I, Lochlyn Zullo, PA-C, have reviewed all documentation's for this visit.  The documentation on 02/19/22 for the exam, diagnosis, procedures and orders are all accurate and complete.

## 2022-08-08 ENCOUNTER — Other Ambulatory Visit
Admission: RE | Admit: 2022-08-08 | Discharge: 2022-08-08 | Disposition: A | Discharge status: Home / Self Care | Payer: Medicare HMO | Attending: Family Medicine | Admitting: Family Medicine

## 2022-08-08 DIAGNOSIS — R5383 Other fatigue: Secondary | ICD-10-CM | POA: Insufficient documentation

## 2022-08-08 DIAGNOSIS — N400 Enlarged prostate without lower urinary tract symptoms: Secondary | ICD-10-CM | POA: Insufficient documentation

## 2022-08-08 DIAGNOSIS — E782 Mixed hyperlipidemia: Secondary | ICD-10-CM | POA: Insufficient documentation

## 2022-08-08 LAB — CBC
HCT: 44.8 % (ref 39.0–52.0)
Hemoglobin: 15 g/dL (ref 13.0–17.0)
MCH: 29.4 pg (ref 26.0–34.0)
MCHC: 33.5 g/dL (ref 30.0–36.0)
MCV: 87.7 fL (ref 80.0–100.0)
Platelets: 271 10*3/uL (ref 150–400)
RBC: 5.11 MIL/uL (ref 4.22–5.81)
RDW: 12.9 % (ref 11.5–15.5)
WBC: 6.1 10*3/uL (ref 4.0–10.5)
nRBC: 0 % (ref 0.0–0.2)

## 2022-08-08 LAB — COMPREHENSIVE METABOLIC PANEL
ALT: 31 U/L (ref 0–44)
AST: 26 U/L (ref 15–41)
Albumin: 4 g/dL (ref 3.5–5.0)
Alkaline Phosphatase: 67 U/L (ref 38–126)
Anion gap: 7 (ref 5–15)
BUN: 21 mg/dL (ref 8–23)
CO2: 25 mmol/L (ref 22–32)
Calcium: 9 mg/dL (ref 8.9–10.3)
Chloride: 107 mmol/L (ref 98–111)
Creatinine, Ser: 0.96 mg/dL (ref 0.61–1.24)
GFR, Estimated: >60 mL/min (ref >60)
Glucose, Bld: 98 mg/dL (ref 70–99)
Potassium: 4.2 mmol/L (ref 3.5–5.1)
Sodium: 139 mmol/L (ref 135–145)
Total Bilirubin: 0.9 mg/dL (ref 0.3–1.2)
Total Protein: 7.2 g/dL (ref 6.5–8.1)

## 2022-08-08 LAB — LIPID PANEL
Cholesterol: 264 mg/dL — ABNORMAL HIGH (ref 0–200)
HDL: 53 mg/dL (ref >40)
LDL Cholesterol: 152 mg/dL — ABNORMAL HIGH (ref 0–99)
Total CHOL/HDL Ratio: 5 RATIO
Triglycerides: 294 mg/dL — ABNORMAL HIGH (ref <150)
VLDL: 59 mg/dL — ABNORMAL HIGH (ref 0–40)

## 2022-08-08 LAB — PSA: Prostatic Specific Antigen: 2.51 ng/mL (ref 0.00–4.00)

## 2022-08-08 LAB — TSH: TSH: 3.962 u[IU]/mL (ref 0.350–4.500)

## 2022-08-14 DIAGNOSIS — Z0001 Encounter for general adult medical examination with abnormal findings: Secondary | ICD-10-CM | POA: Diagnosis not present

## 2022-08-14 DIAGNOSIS — M545 Low back pain, unspecified: Secondary | ICD-10-CM | POA: Diagnosis not present

## 2022-08-14 DIAGNOSIS — Z6829 Body mass index (BMI) 29.0-29.9, adult: Secondary | ICD-10-CM | POA: Diagnosis not present

## 2022-08-14 DIAGNOSIS — I83812 Varicose veins of left lower extremities with pain: Secondary | ICD-10-CM | POA: Diagnosis not present

## 2022-08-14 DIAGNOSIS — Z23 Encounter for immunization: Secondary | ICD-10-CM | POA: Diagnosis not present

## 2022-08-14 DIAGNOSIS — Z1212 Encounter for screening for malignant neoplasm of rectum: Secondary | ICD-10-CM | POA: Diagnosis not present

## 2022-08-14 DIAGNOSIS — I10 Essential (primary) hypertension: Secondary | ICD-10-CM | POA: Diagnosis not present

## 2022-08-14 DIAGNOSIS — N400 Enlarged prostate without lower urinary tract symptoms: Secondary | ICD-10-CM | POA: Diagnosis not present

## 2022-12-01 DIAGNOSIS — N400 Enlarged prostate without lower urinary tract symptoms: Secondary | ICD-10-CM | POA: Diagnosis not present

## 2022-12-01 DIAGNOSIS — M545 Low back pain, unspecified: Secondary | ICD-10-CM | POA: Diagnosis not present

## 2022-12-01 DIAGNOSIS — Z803 Family history of malignant neoplasm of breast: Secondary | ICD-10-CM | POA: Diagnosis not present

## 2022-12-01 DIAGNOSIS — N529 Male erectile dysfunction, unspecified: Secondary | ICD-10-CM | POA: Diagnosis not present

## 2022-12-01 DIAGNOSIS — I10 Essential (primary) hypertension: Secondary | ICD-10-CM | POA: Diagnosis not present

## 2022-12-01 DIAGNOSIS — Z8249 Family history of ischemic heart disease and other diseases of the circulatory system: Secondary | ICD-10-CM | POA: Diagnosis not present

## 2022-12-01 DIAGNOSIS — E785 Hyperlipidemia, unspecified: Secondary | ICD-10-CM | POA: Diagnosis not present

## 2022-12-01 DIAGNOSIS — M792 Neuralgia and neuritis, unspecified: Secondary | ICD-10-CM | POA: Diagnosis not present

## 2023-02-11 DIAGNOSIS — N400 Enlarged prostate without lower urinary tract symptoms: Secondary | ICD-10-CM | POA: Diagnosis not present

## 2023-02-11 DIAGNOSIS — I83812 Varicose veins of left lower extremities with pain: Secondary | ICD-10-CM | POA: Diagnosis not present

## 2023-02-11 DIAGNOSIS — Z6829 Body mass index (BMI) 29.0-29.9, adult: Secondary | ICD-10-CM | POA: Diagnosis not present

## 2023-02-11 DIAGNOSIS — I10 Essential (primary) hypertension: Secondary | ICD-10-CM | POA: Diagnosis not present

## 2023-02-11 DIAGNOSIS — M545 Low back pain, unspecified: Secondary | ICD-10-CM | POA: Diagnosis not present

## 2023-02-12 DIAGNOSIS — L820 Inflamed seborrheic keratosis: Secondary | ICD-10-CM | POA: Diagnosis not present

## 2023-02-12 DIAGNOSIS — D2272 Melanocytic nevi of left lower limb, including hip: Secondary | ICD-10-CM | POA: Diagnosis not present

## 2023-02-12 DIAGNOSIS — D2262 Melanocytic nevi of left upper limb, including shoulder: Secondary | ICD-10-CM | POA: Diagnosis not present

## 2023-02-12 DIAGNOSIS — D2271 Melanocytic nevi of right lower limb, including hip: Secondary | ICD-10-CM | POA: Diagnosis not present

## 2023-02-12 DIAGNOSIS — L821 Other seborrheic keratosis: Secondary | ICD-10-CM | POA: Diagnosis not present

## 2023-02-12 DIAGNOSIS — D2261 Melanocytic nevi of right upper limb, including shoulder: Secondary | ICD-10-CM | POA: Diagnosis not present

## 2023-02-12 DIAGNOSIS — D225 Melanocytic nevi of trunk: Secondary | ICD-10-CM | POA: Diagnosis not present

## 2023-02-12 DIAGNOSIS — L570 Actinic keratosis: Secondary | ICD-10-CM | POA: Diagnosis not present

## 2023-03-14 DIAGNOSIS — H2513 Age-related nuclear cataract, bilateral: Secondary | ICD-10-CM | POA: Diagnosis not present

## 2023-07-23 DIAGNOSIS — J329 Chronic sinusitis, unspecified: Secondary | ICD-10-CM | POA: Diagnosis not present

## 2023-07-29 DIAGNOSIS — L814 Other melanin hyperpigmentation: Secondary | ICD-10-CM | POA: Diagnosis not present

## 2023-07-29 DIAGNOSIS — Z85828 Personal history of other malignant neoplasm of skin: Secondary | ICD-10-CM | POA: Diagnosis not present

## 2023-07-29 DIAGNOSIS — D1801 Hemangioma of skin and subcutaneous tissue: Secondary | ICD-10-CM | POA: Diagnosis not present

## 2023-07-29 DIAGNOSIS — L570 Actinic keratosis: Secondary | ICD-10-CM | POA: Diagnosis not present

## 2023-07-29 DIAGNOSIS — L821 Other seborrheic keratosis: Secondary | ICD-10-CM | POA: Diagnosis not present

## 2023-08-19 ENCOUNTER — Other Ambulatory Visit
Admission: RE | Admit: 2023-08-19 | Discharge: 2023-08-19 | Disposition: A | Discharge status: Home / Self Care | Payer: Medicare HMO | Attending: Family Medicine | Admitting: Family Medicine

## 2023-08-19 DIAGNOSIS — N400 Enlarged prostate without lower urinary tract symptoms: Secondary | ICD-10-CM | POA: Diagnosis present

## 2023-08-19 DIAGNOSIS — E785 Hyperlipidemia, unspecified: Secondary | ICD-10-CM | POA: Insufficient documentation

## 2023-08-19 LAB — TSH: TSH: 4.848 u[IU]/mL — ABNORMAL HIGH (ref 0.350–4.500)

## 2023-08-19 LAB — COMPREHENSIVE METABOLIC PANEL WITH GFR
ALT: 27 U/L (ref 0–44)
AST: 22 U/L (ref 15–41)
Albumin: 3.9 g/dL (ref 3.5–5.0)
Alkaline Phosphatase: 78 U/L (ref 38–126)
Anion gap: 11 (ref 5–15)
BUN: 18 mg/dL (ref 8–23)
CO2: 27 mmol/L (ref 22–32)
Calcium: 9 mg/dL (ref 8.9–10.3)
Chloride: 102 mmol/L (ref 98–111)
Creatinine, Ser: 1.02 mg/dL (ref 0.61–1.24)
GFR, Estimated: >60 mL/min
Glucose, Bld: 94 mg/dL (ref 70–99)
Potassium: 4.2 mmol/L (ref 3.5–5.1)
Sodium: 140 mmol/L (ref 135–145)
Total Bilirubin: 0.6 mg/dL (ref 0.0–1.2)
Total Protein: 7.5 g/dL (ref 6.5–8.1)

## 2023-08-19 LAB — PSA: Prostatic Specific Antigen: 3.66 ng/mL (ref 0.00–4.00)

## 2023-08-19 LAB — LIPID PANEL
Cholesterol: 228 mg/dL — ABNORMAL HIGH (ref 0–200)
HDL: 54 mg/dL
LDL Cholesterol: 126 mg/dL — ABNORMAL HIGH (ref 0–99)
Total CHOL/HDL Ratio: 4.2 ratio
Triglycerides: 242 mg/dL — ABNORMAL HIGH
VLDL: 48 mg/dL — ABNORMAL HIGH (ref 0–40)

## 2023-08-19 LAB — CBC
HCT: 45.9 % (ref 39.0–52.0)
Hemoglobin: 14.9 g/dL (ref 13.0–17.0)
MCH: 29.2 pg (ref 26.0–34.0)
MCHC: 32.5 g/dL (ref 30.0–36.0)
MCV: 90 fL (ref 80.0–100.0)
Platelets: 288 10*3/uL (ref 150–400)
RBC: 5.1 MIL/uL (ref 4.22–5.81)
RDW: 13 % (ref 11.5–15.5)
WBC: 6.8 10*3/uL (ref 4.0–10.5)
nRBC: 0 % (ref 0.0–0.2)

## 2023-08-19 LAB — VITAMIN D 25 HYDROXY (VIT D DEFICIENCY, FRACTURES): Vit D, 25-Hydroxy: 22.56 ng/mL — ABNORMAL LOW (ref 30–100)

## 2024-01-11 DIAGNOSIS — Z8249 Family history of ischemic heart disease and other diseases of the circulatory system: Secondary | ICD-10-CM | POA: Diagnosis not present

## 2024-01-11 DIAGNOSIS — Z008 Encounter for other general examination: Secondary | ICD-10-CM | POA: Diagnosis not present

## 2024-01-11 DIAGNOSIS — H269 Unspecified cataract: Secondary | ICD-10-CM | POA: Diagnosis not present

## 2024-01-11 DIAGNOSIS — N400 Enlarged prostate without lower urinary tract symptoms: Secondary | ICD-10-CM | POA: Diagnosis not present

## 2024-01-11 DIAGNOSIS — I10 Essential (primary) hypertension: Secondary | ICD-10-CM | POA: Diagnosis not present

## 2024-01-26 DIAGNOSIS — Z1212 Encounter for screening for malignant neoplasm of rectum: Secondary | ICD-10-CM | POA: Diagnosis not present

## 2024-01-26 DIAGNOSIS — Z1211 Encounter for screening for malignant neoplasm of colon: Secondary | ICD-10-CM | POA: Diagnosis not present

## 2024-03-19 DIAGNOSIS — H2513 Age-related nuclear cataract, bilateral: Secondary | ICD-10-CM | POA: Diagnosis not present

## 2024-05-19 ENCOUNTER — Encounter (INDEPENDENT_AMBULATORY_CARE_PROVIDER_SITE_OTHER): Payer: Self-pay | Admitting: *Deleted

## 2024-07-29 DIAGNOSIS — D2261 Melanocytic nevi of right upper limb, including shoulder: Secondary | ICD-10-CM | POA: Diagnosis not present

## 2024-07-29 DIAGNOSIS — D2262 Melanocytic nevi of left upper limb, including shoulder: Secondary | ICD-10-CM | POA: Diagnosis not present

## 2024-07-29 DIAGNOSIS — L565 Disseminated superficial actinic porokeratosis (DSAP): Secondary | ICD-10-CM | POA: Diagnosis not present

## 2024-07-29 DIAGNOSIS — D225 Melanocytic nevi of trunk: Secondary | ICD-10-CM | POA: Diagnosis not present

## 2024-07-29 DIAGNOSIS — D1801 Hemangioma of skin and subcutaneous tissue: Secondary | ICD-10-CM | POA: Diagnosis not present

## 2024-07-29 DIAGNOSIS — L821 Other seborrheic keratosis: Secondary | ICD-10-CM | POA: Diagnosis not present

## 2024-07-29 DIAGNOSIS — Z85828 Personal history of other malignant neoplasm of skin: Secondary | ICD-10-CM | POA: Diagnosis not present

## 2024-07-29 DIAGNOSIS — L570 Actinic keratosis: Secondary | ICD-10-CM | POA: Diagnosis not present

## 2024-07-29 DIAGNOSIS — L853 Xerosis cutis: Secondary | ICD-10-CM | POA: Diagnosis not present

## 2024-08-19 ENCOUNTER — Other Ambulatory Visit
Admission: RE | Admit: 2024-08-19 | Discharge: 2024-08-19 | Disposition: A | Discharge status: Home / Self Care | Source: Ambulatory Visit | Attending: Family Medicine | Admitting: Family Medicine

## 2024-08-19 DIAGNOSIS — N400 Enlarged prostate without lower urinary tract symptoms: Secondary | ICD-10-CM | POA: Diagnosis not present

## 2024-08-19 DIAGNOSIS — Z131 Encounter for screening for diabetes mellitus: Secondary | ICD-10-CM | POA: Insufficient documentation

## 2024-08-19 DIAGNOSIS — R972 Elevated prostate specific antigen [PSA]: Secondary | ICD-10-CM | POA: Diagnosis not present

## 2024-08-19 DIAGNOSIS — E782 Mixed hyperlipidemia: Secondary | ICD-10-CM | POA: Insufficient documentation

## 2024-08-19 LAB — LIPID PANEL
Cholesterol: 221 mg/dL — ABNORMAL HIGH (ref 0–200)
HDL: 50 mg/dL
LDL Cholesterol: 122 mg/dL — ABNORMAL HIGH (ref 0–99)
Total CHOL/HDL Ratio: 4.4 ratio
Triglycerides: 243 mg/dL — ABNORMAL HIGH
VLDL: 49 mg/dL — ABNORMAL HIGH (ref 0–40)

## 2024-08-19 LAB — CBC WITH DIFFERENTIAL/PLATELET
Abs Immature Granulocytes: 0.02 K/uL (ref 0.00–0.07)
Basophils Absolute: 0.1 K/uL (ref 0.0–0.1)
Basophils Relative: 1 %
Eosinophils Absolute: 0.2 K/uL (ref 0.0–0.5)
Eosinophils Relative: 2 %
HCT: 44.8 % (ref 39.0–52.0)
Hemoglobin: 14.6 g/dL (ref 13.0–17.0)
Immature Granulocytes: 0 %
Lymphocytes Relative: 40 %
Lymphs Abs: 2.5 K/uL (ref 0.7–4.0)
MCH: 29 pg (ref 26.0–34.0)
MCHC: 32.6 g/dL (ref 30.0–36.0)
MCV: 88.9 fL (ref 80.0–100.0)
Monocytes Absolute: 0.6 K/uL (ref 0.1–1.0)
Monocytes Relative: 10 %
Neutro Abs: 2.9 K/uL (ref 1.7–7.7)
Neutrophils Relative %: 47 %
Platelets: 284 K/uL (ref 150–400)
RBC: 5.04 MIL/uL (ref 4.22–5.81)
RDW: 13.1 % (ref 11.5–15.5)
WBC: 6.2 K/uL (ref 4.0–10.5)
nRBC: 0 % (ref 0.0–0.2)

## 2024-08-19 LAB — COMPREHENSIVE METABOLIC PANEL WITH GFR
ALT: 23 U/L (ref 0–44)
AST: 24 U/L (ref 15–41)
Albumin: 4.2 g/dL (ref 3.5–5.0)
Alkaline Phosphatase: 77 U/L (ref 38–126)
Anion gap: 8 (ref 5–15)
BUN: 17 mg/dL (ref 8–23)
CO2: 27 mmol/L (ref 22–32)
Calcium: 9.6 mg/dL (ref 8.9–10.3)
Chloride: 105 mmol/L (ref 98–111)
Creatinine, Ser: 1.06 mg/dL (ref 0.61–1.24)
GFR, Estimated: >60 mL/min
Glucose, Bld: 98 mg/dL (ref 70–99)
Potassium: 4.4 mmol/L (ref 3.5–5.1)
Sodium: 140 mmol/L (ref 135–145)
Total Bilirubin: 0.5 mg/dL (ref 0.0–1.2)
Total Protein: 7 g/dL (ref 6.5–8.1)

## 2024-08-19 LAB — TSH: TSH: 4.68 u[IU]/mL — ABNORMAL HIGH (ref 0.350–4.500)

## 2024-08-20 LAB — PSA (REFLEX TO FREE) (SERIAL): Prostate Specific Ag, Serum: 2.2 ng/mL (ref 0.0–4.0)
